# Patient Record
Sex: Male | Born: 1935 | Race: Black or African American | Hispanic: No | Marital: Single
Health system: Southern US, Community
[De-identification: ages and names within clinical notes are randomized; demographics above are authoritative.]

---

## 2013-05-24 ENCOUNTER — Inpatient Hospital Stay: Payer: Self-pay | Admitting: Internal Medicine

## 2013-05-24 LAB — CBC WITH DIFFERENTIAL/PLATELET
Basophil #: 0 10*3/uL (ref 0.0–0.1)
Eosinophil #: 0 10*3/uL (ref 0.0–0.7)
Eosinophil %: 0.1 %
HCT: 41.9 % (ref 40.0–52.0)
HGB: 13.7 g/dL (ref 13.0–18.0)
Lymphocyte #: 1 10*3/uL (ref 1.0–3.6)
Lymphocyte %: 13.5 %
MCH: 29.8 pg (ref 26.0–34.0)
MCV: 91 fL (ref 80–100)
Monocyte #: 0.5 x10 3/mm (ref 0.2–1.0)
Monocyte %: 6 %
Neutrophil #: 6.2 10*3/uL (ref 1.4–6.5)
Platelet: 241 10*3/uL (ref 150–440)
RBC: 4.61 10*6/uL (ref 4.40–5.90)
RDW: 14.8 % — ABNORMAL HIGH (ref 11.5–14.5)
WBC: 7.8 10*3/uL (ref 3.8–10.6)

## 2013-05-24 LAB — BASIC METABOLIC PANEL
Anion Gap: 9 (ref 7–16)
Chloride: 103 mmol/L (ref 98–107)
EGFR (African American): 56 — ABNORMAL LOW
EGFR (Non-African Amer.): 49 — ABNORMAL LOW
Osmolality: 276 (ref 275–301)
Potassium: 3.6 mmol/L (ref 3.5–5.1)
Sodium: 137 mmol/L (ref 136–145)

## 2013-05-24 LAB — PROTIME-INR
INR: 1
Prothrombin Time: 13.4 secs (ref 11.5–14.7)

## 2013-05-24 LAB — URINALYSIS, COMPLETE
Glucose,UR: NEGATIVE mg/dL (ref 0–75)
Leukocyte Esterase: NEGATIVE
Nitrite: NEGATIVE
Protein: 100
Specific Gravity: 1.019 (ref 1.003–1.030)
Squamous Epithelial: NONE SEEN

## 2013-05-25 LAB — CBC WITH DIFFERENTIAL/PLATELET
Basophil %: 0.3 %
Eosinophil #: 0 10*3/uL (ref 0.0–0.7)
Eosinophil %: 0 %
HCT: 38.1 % — ABNORMAL LOW (ref 40.0–52.0)
HGB: 12.7 g/dL — ABNORMAL LOW (ref 13.0–18.0)
Lymphocyte #: 0.7 10*3/uL — ABNORMAL LOW (ref 1.0–3.6)
Lymphocyte %: 9.4 %
MCHC: 33.3 g/dL (ref 32.0–36.0)
MCV: 90 fL (ref 80–100)
Neutrophil #: 6.2 10*3/uL (ref 1.4–6.5)
Neutrophil %: 81.1 %
Platelet: 223 10*3/uL (ref 150–440)
RBC: 4.23 10*6/uL — ABNORMAL LOW (ref 4.40–5.90)
RDW: 14.6 % — ABNORMAL HIGH (ref 11.5–14.5)
WBC: 7.6 10*3/uL (ref 3.8–10.6)

## 2013-05-25 LAB — BASIC METABOLIC PANEL
BUN: 18 mg/dL (ref 7–18)
Calcium, Total: 9 mg/dL (ref 8.5–10.1)
Chloride: 105 mmol/L (ref 98–107)
Co2: 24 mmol/L (ref 21–32)
EGFR (African American): 59 — ABNORMAL LOW
EGFR (Non-African Amer.): 51 — ABNORMAL LOW
Osmolality: 277 (ref 275–301)
Potassium: 3.8 mmol/L (ref 3.5–5.1)
Sodium: 137 mmol/L (ref 136–145)

## 2013-05-26 LAB — PLATELET COUNT: Platelet: 179 10*3/uL (ref 150–440)

## 2013-05-26 LAB — BASIC METABOLIC PANEL
Anion Gap: 8 (ref 7–16)
BUN: 20 mg/dL — ABNORMAL HIGH (ref 7–18)
Calcium, Total: 8.7 mg/dL (ref 8.5–10.1)
Chloride: 104 mmol/L (ref 98–107)
Co2: 23 mmol/L (ref 21–32)
Creatinine: 1.34 mg/dL — ABNORMAL HIGH (ref 0.60–1.30)
EGFR (Non-African Amer.): 50 — ABNORMAL LOW
Glucose: 118 mg/dL — ABNORMAL HIGH (ref 65–99)
Osmolality: 274 (ref 275–301)
Potassium: 3.6 mmol/L (ref 3.5–5.1)
Sodium: 135 mmol/L — ABNORMAL LOW (ref 136–145)

## 2013-05-26 LAB — HEMOGLOBIN: HGB: 11.4 g/dL — ABNORMAL LOW (ref 13.0–18.0)

## 2013-05-30 LAB — PATHOLOGY REPORT

## 2014-01-09 DEATH — deceased

## 2014-10-02 IMAGING — CR DG CHEST 1V
1 series · 2 of 2 positions shown · non-contrast
Comparison: none

REASON FOR EXAM: fall
COMMENTS:

PROCEDURE:     DXR - DXR CHEST 1 VIEWAP OR PA  - May 24, 2013  [DATE]
RESULT:     The hemidiaphragm on the left is elevated. The aerated portions
of the lungs appear clear. The cardiac silhouette is mildly enlarged.
Pulmonary vascularity is not clearly engorged. There is no pleural effusion.

[Series 2: t chest supine · 0.14mm/px · 2 of 2 slices shown]
[im 1/2]
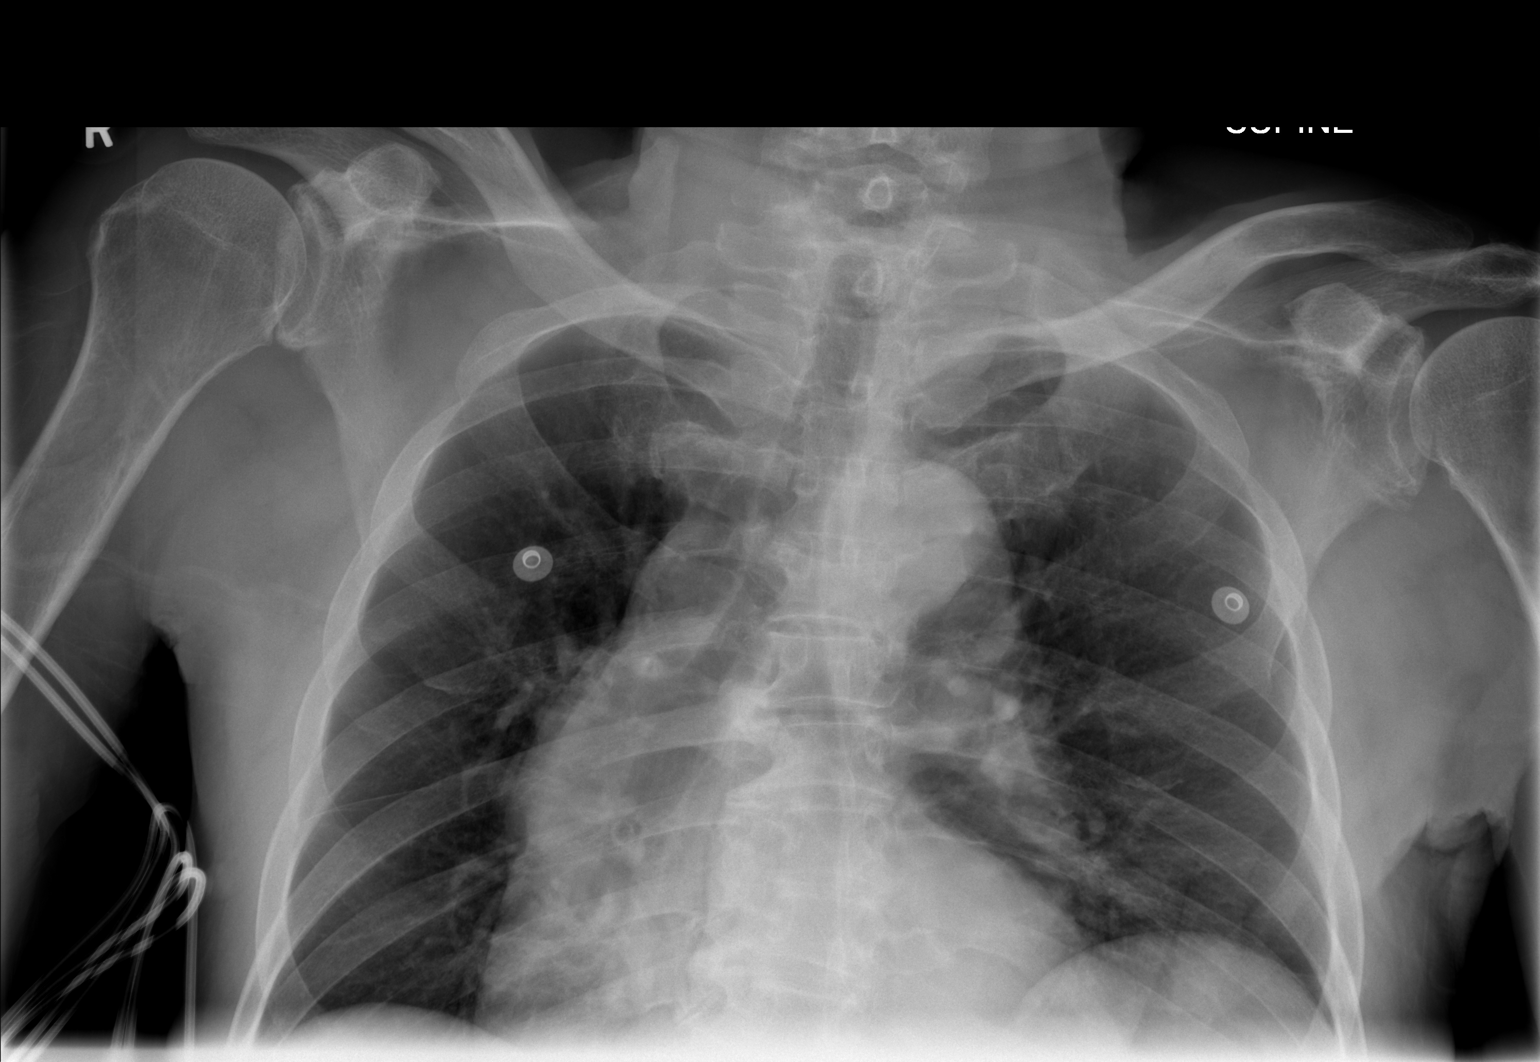
[im 2/2]
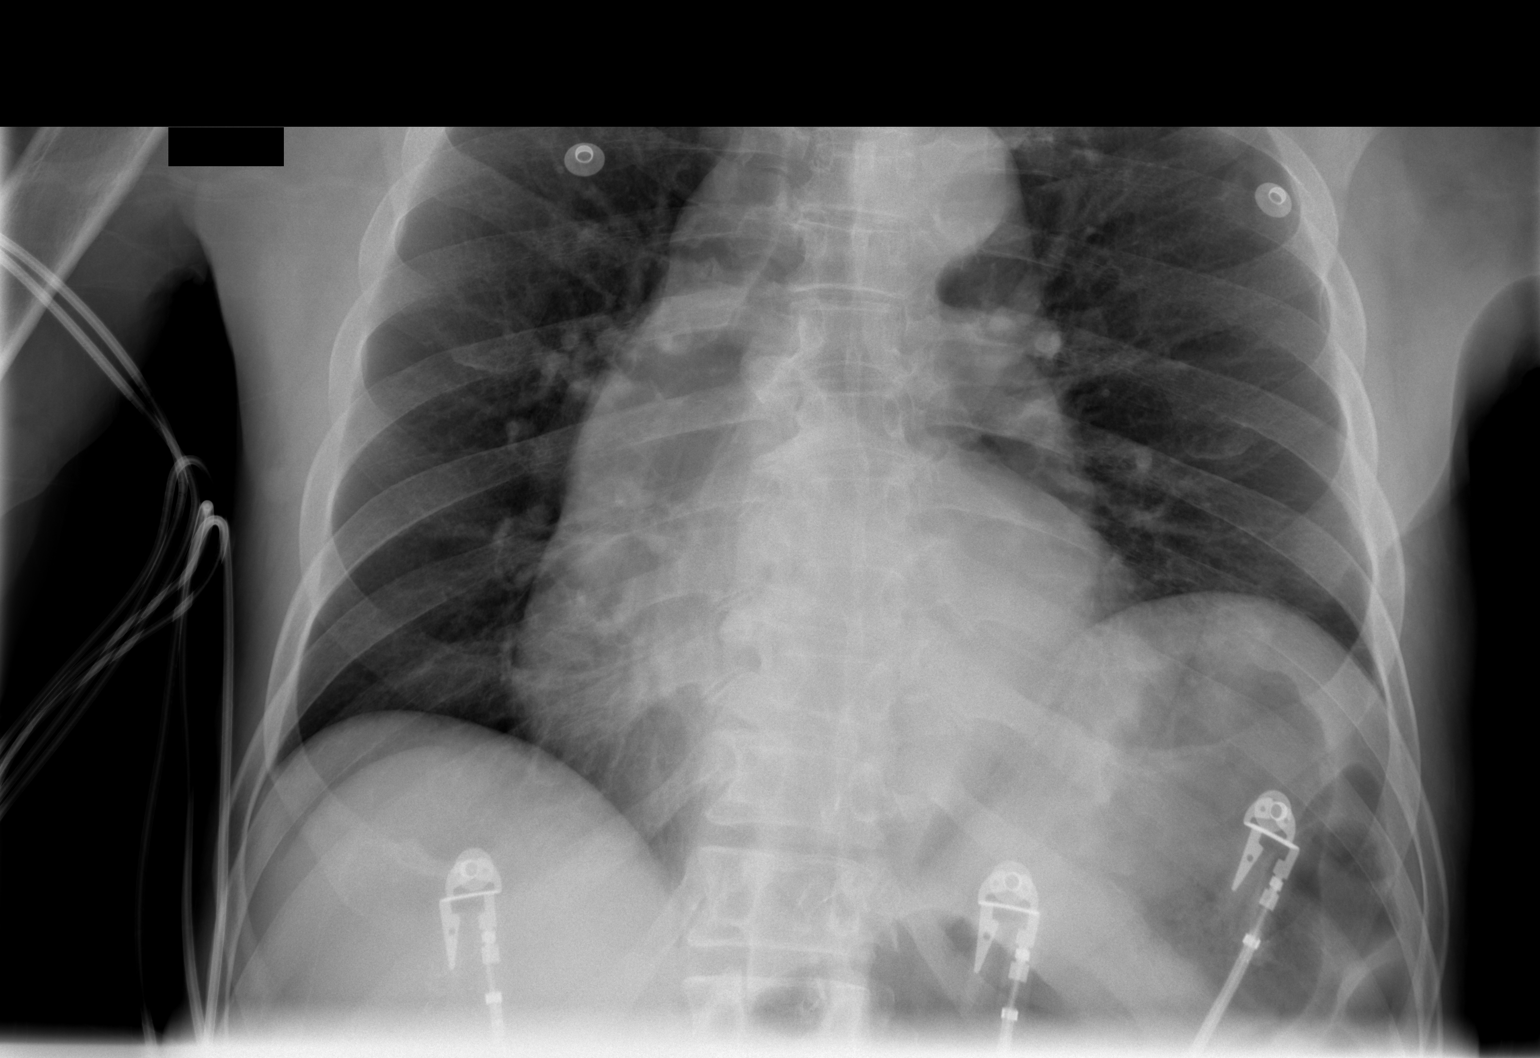

[2 of 2 positions shown; findings below may reference images not displayed]

IMPRESSION: There may be low-grade compensated CHF. There is no
evidence of pulmonary edema nor of pneumonia.

[REDACTED]

## 2014-10-02 IMAGING — CR DG HIP COMPLETE 2+V*L*
3 series · 4 of 4 positions shown · non-contrast
Comparison: none

REASON FOR EXAM: fall with L hip pain
COMMENTS:

[t hip ap left (1 of 3)]
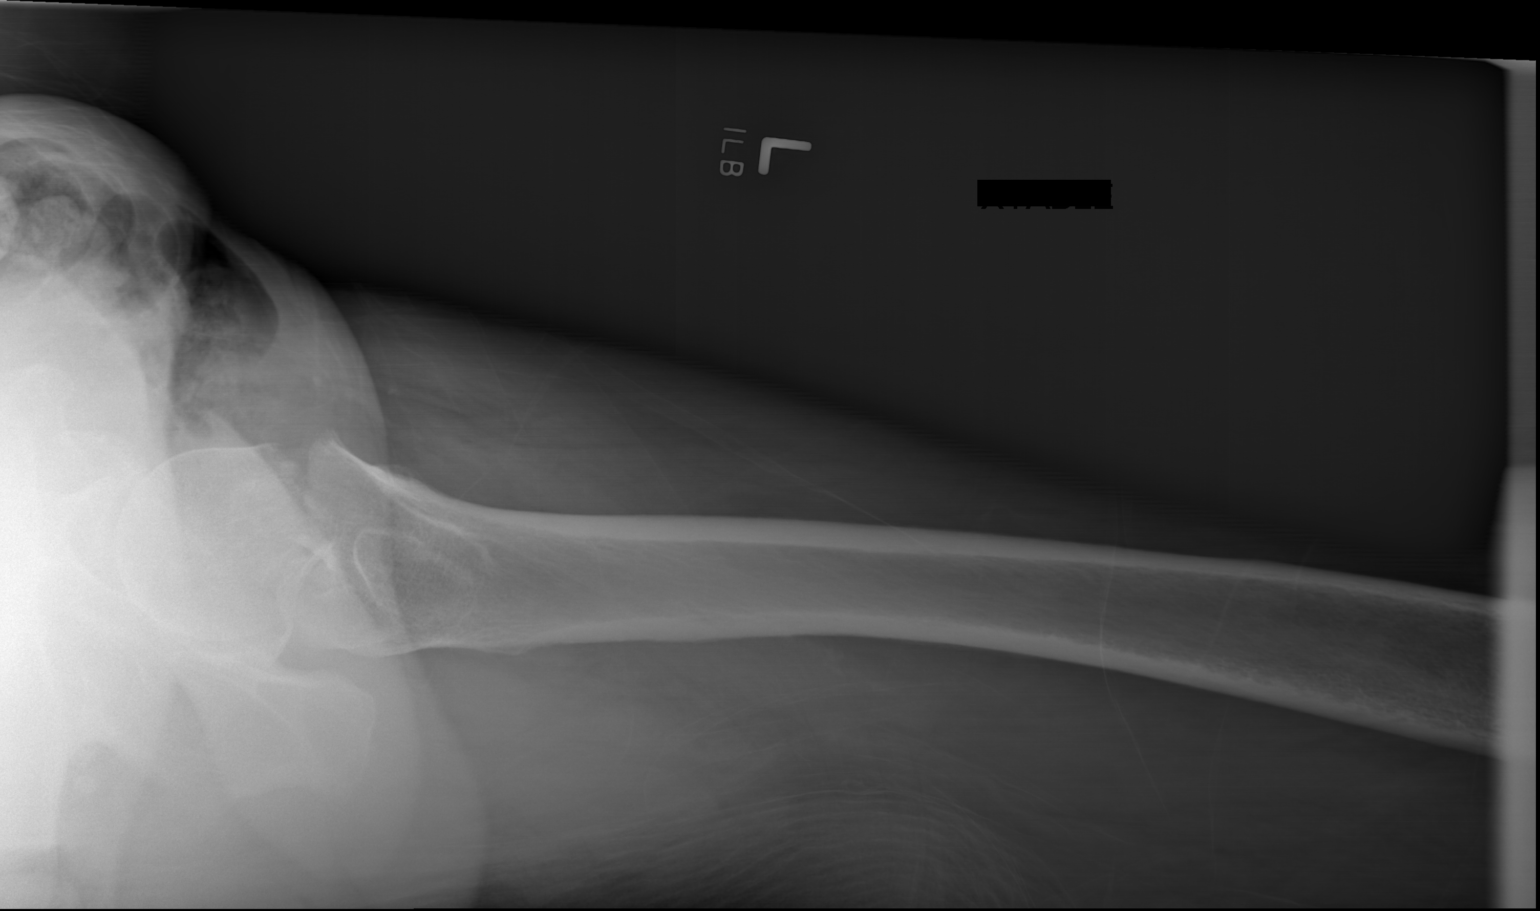

[t hip ap left (2 of 3)]
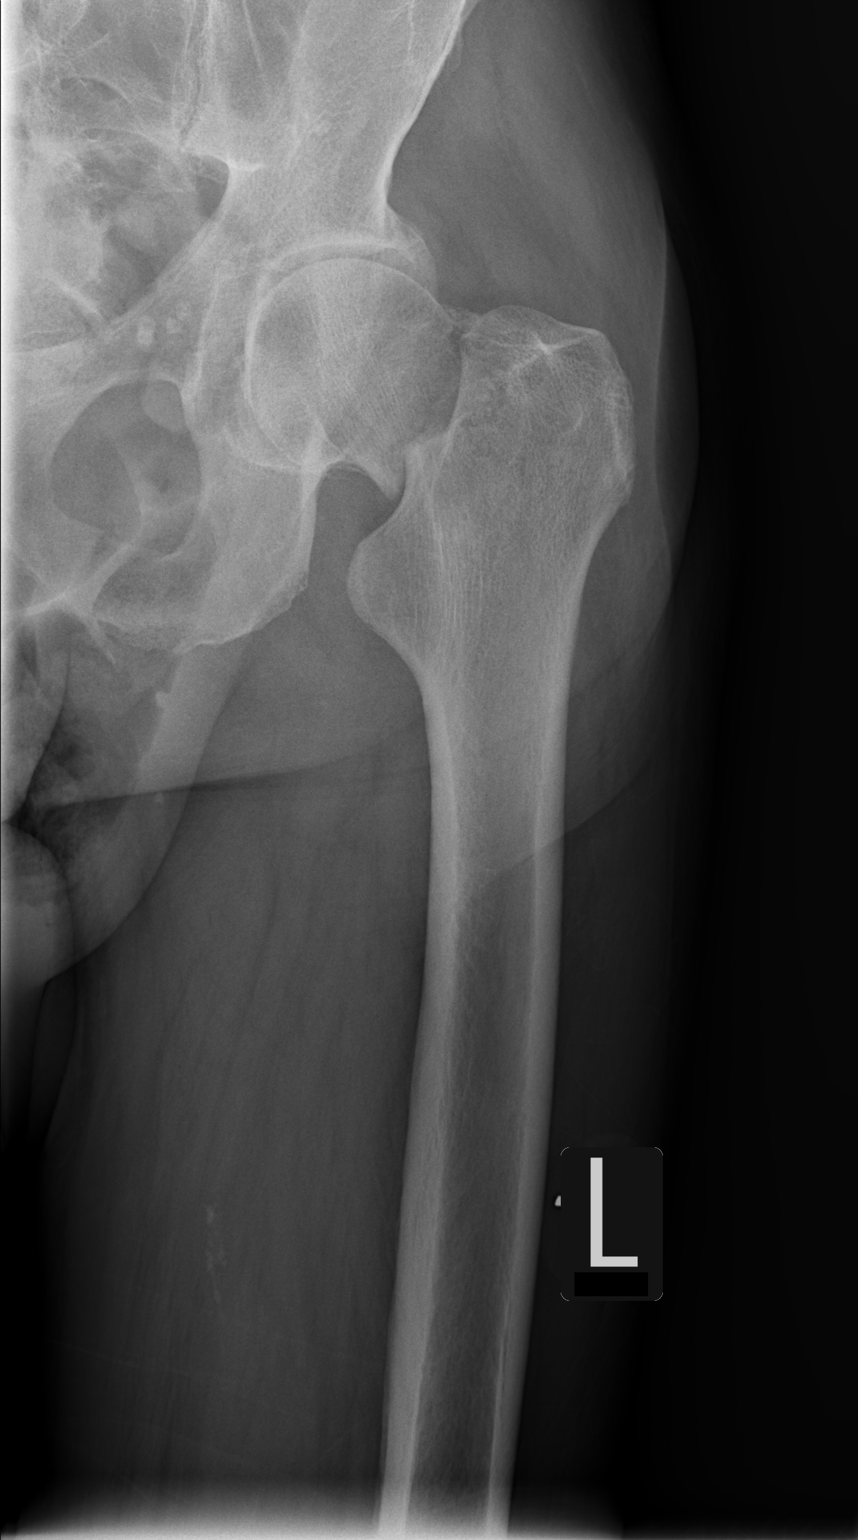

[Series 2: t hip ap left · 0.14mm/px · 2 of 2 slices shown (3 of 3)]
[im 1/2]
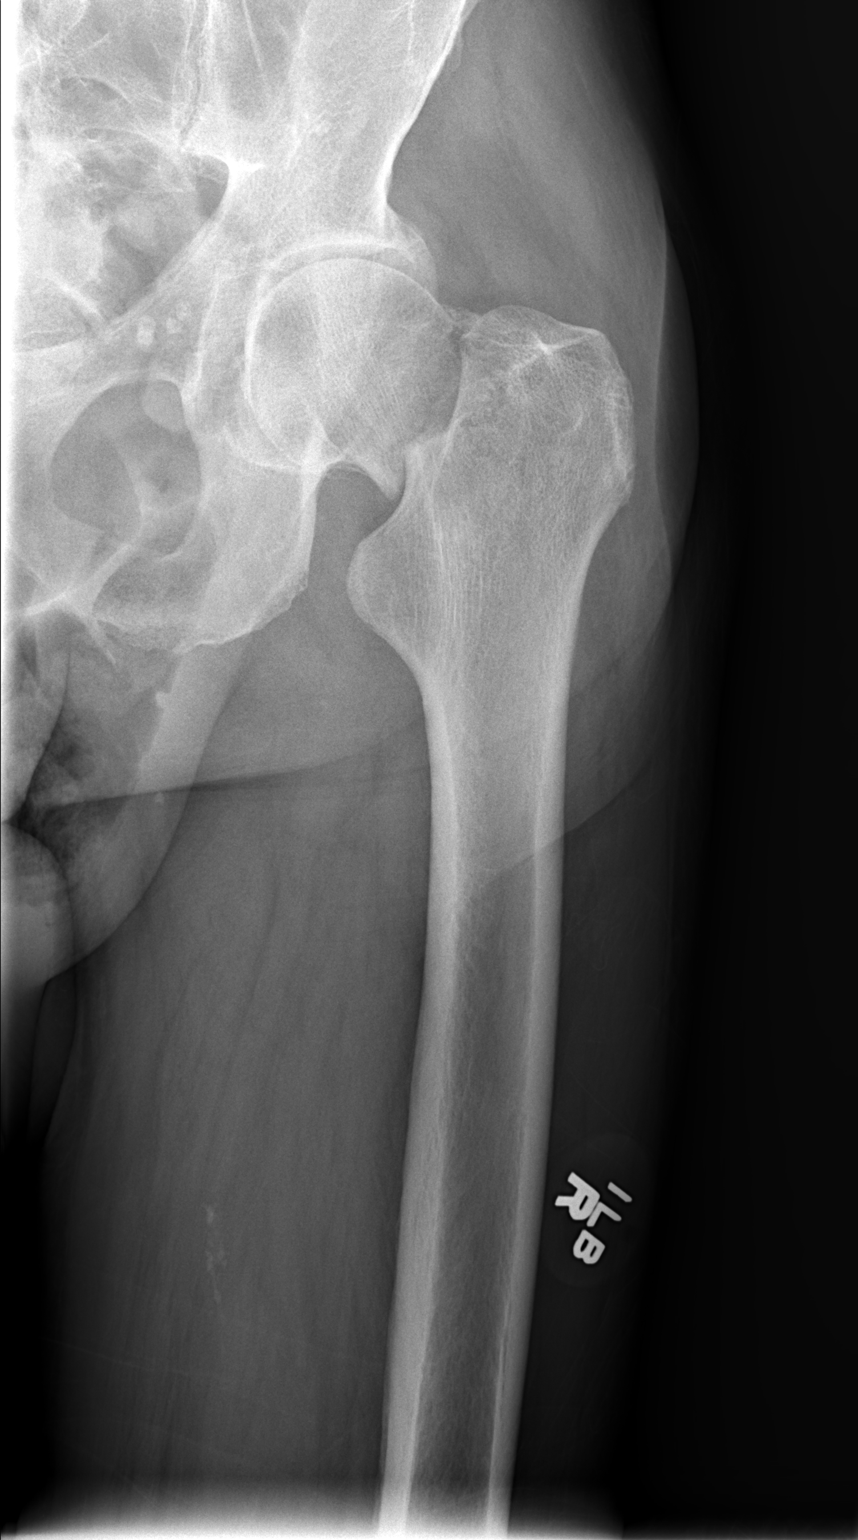
[im 2/2]
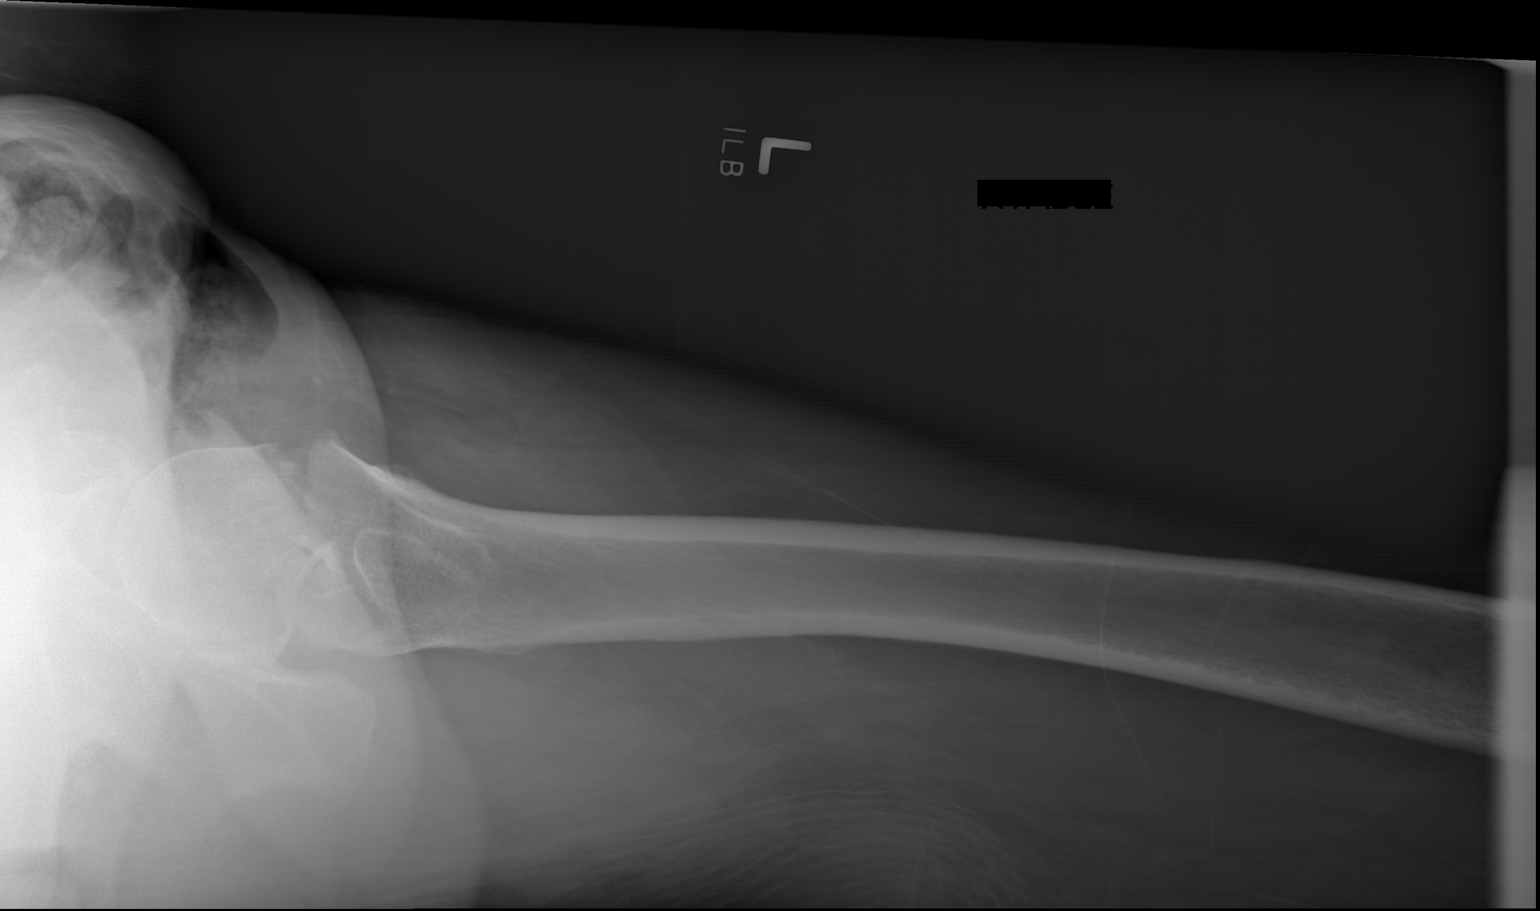

[4 of 4 positions shown; findings below may reference images not displayed]

PROCEDURE:     DXR - DXR HIP LEFT COMPLETE  - May 24, 2013  [DATE]

RESULT:      AP and frog-leg lateral views of the left hip reveal a fracture
at the base of the femoral neck. The femoral head remains normally
positioned within the acetabulum. No intertrochanteric or subtrochanteric
fracture is demonstrated.
IMPRESSION: The patient sustained a fracture of the neck of the right
femur.

[REDACTED]

Addendum: in the impression above I misspoke and indicated that the fracture
is of the right femur. In fact the fracture is of the left femur. The AP
view of the left hip is mismarked however with a right marker present. The
crosstable lateral view is correctly marked "left".

This addendum was created at [DATE] p.m. on 24 May, 2013.

## 2014-10-03 IMAGING — CR DG HIP COMPLETE 2+V*L*
1 series · 2 of 2 positions shown · non-contrast
Comparison: none

REASON FOR EXAM: s/p THA
COMMENTS:   Bedside (portable):Y

[Series 1: ap · 0.17mm/px · 2 of 2 slices shown]
[im 1/2]
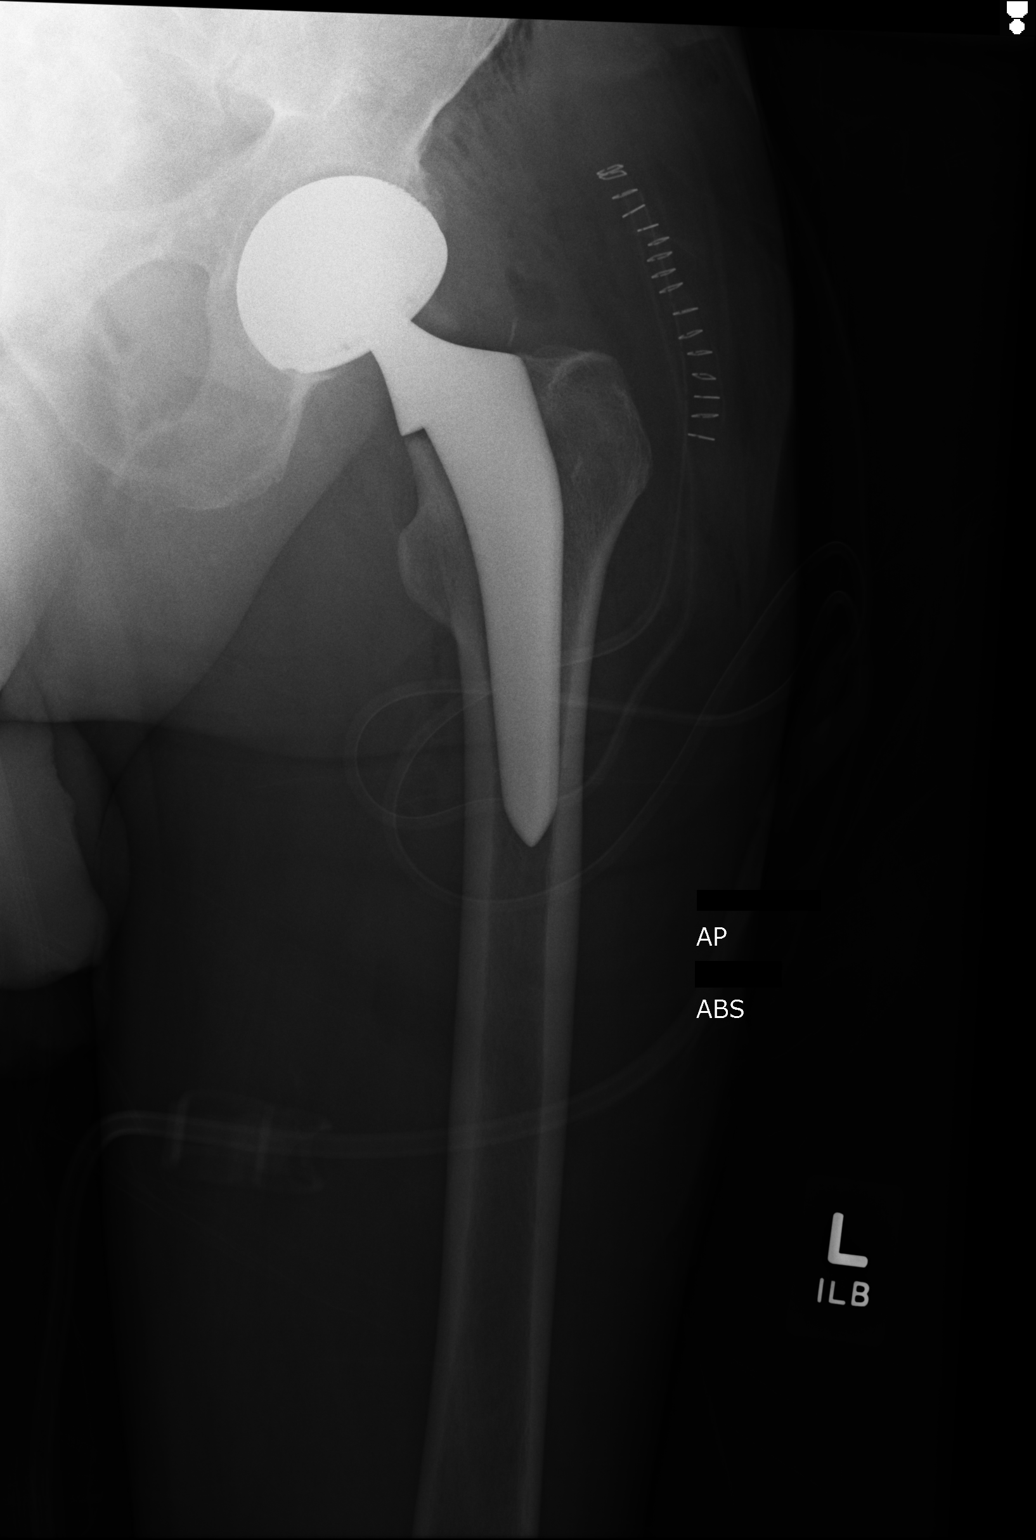
[im 2/2]
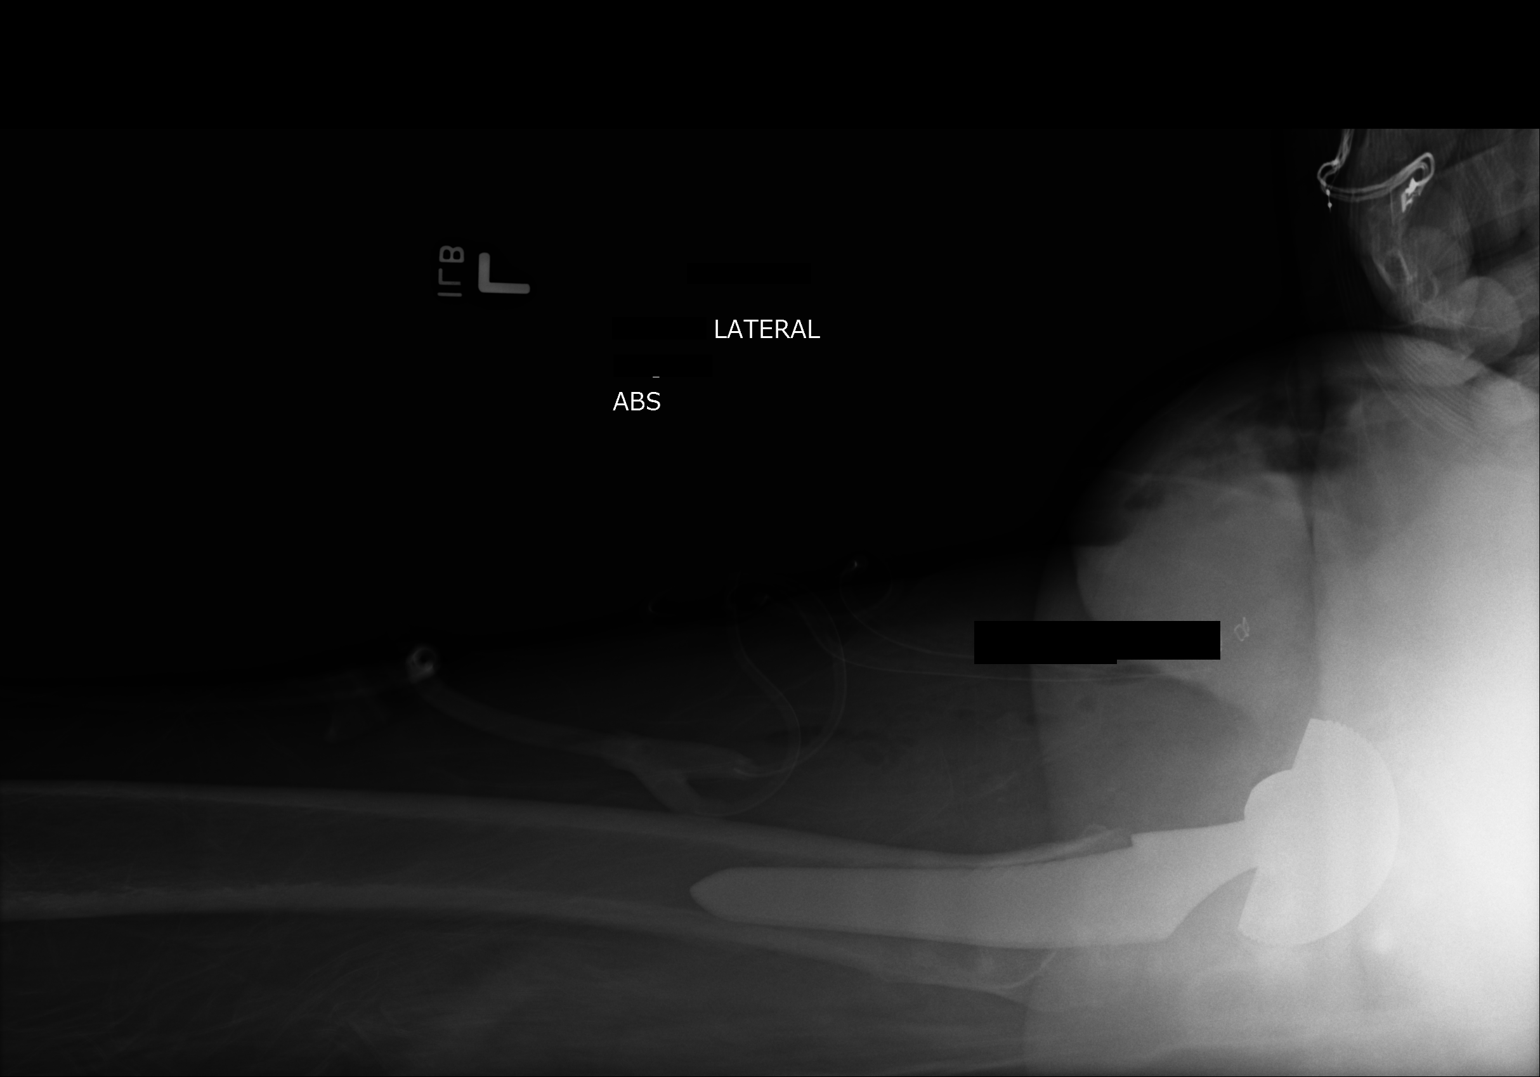

[2 of 2 positions shown; findings below may reference images not displayed]

PROCEDURE:     DXR - DXR HIP LEFT COMPLETE  - May 25, 2013  [DATE]

RESULT:

The patient is status post total left hip replacement. Hardware appears
intact without evidence of loosening or failure. Native osseous structures
demonstrate no evidence of fracture or dislocation. Skin staples and
surgical drains are appreciated.
IMPRESSION: The patient is status post total left hip replacement.

The remainder of the interpretation will be left to the performing physician.

## 2014-10-06 IMAGING — US US EXTREM LOW VENOUS*L*
1 series · 14 of 20 positions shown · non-contrast
Comparison: none

REASON FOR EXAM: swelling, pain, s/p THR
COMMENTS:

[Series 1: us extrem low venous*left* · 0.07mm/px · 14 of 20 slices shown]
[im 1/20]
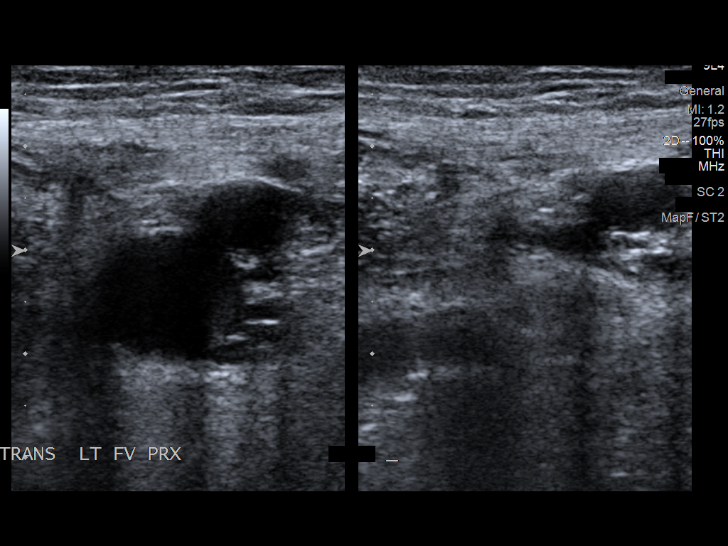
[im 3/20]
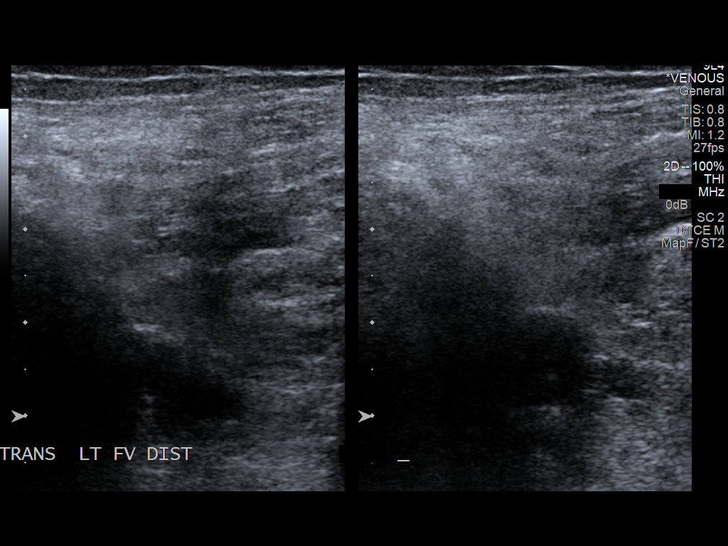
[im 4/20]
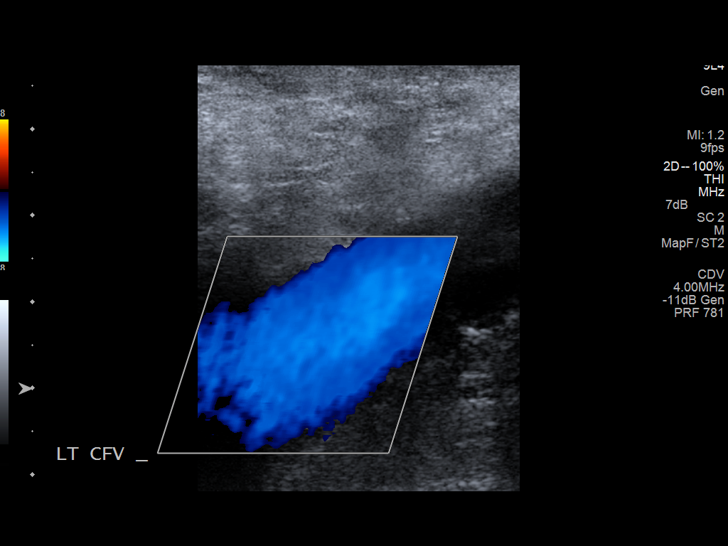
[im 6/20]
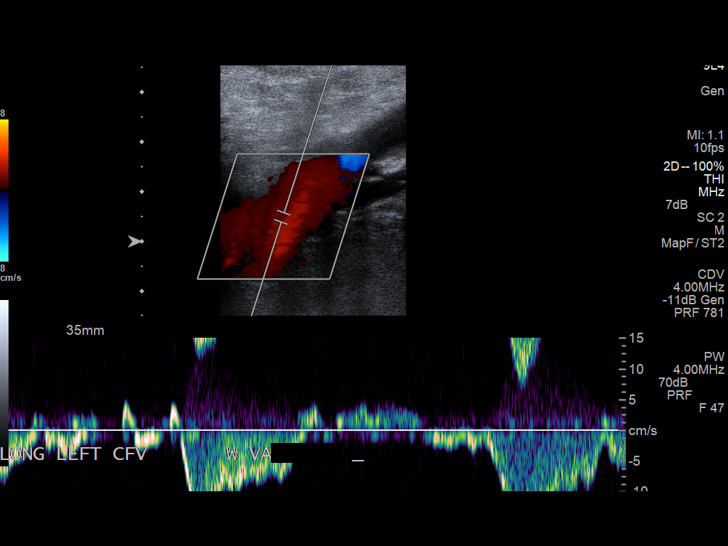
[im 7/20]
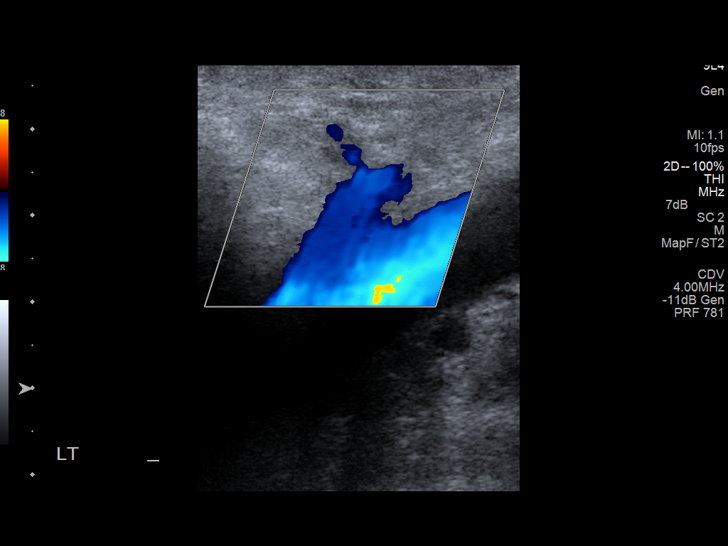
[im 8/20]
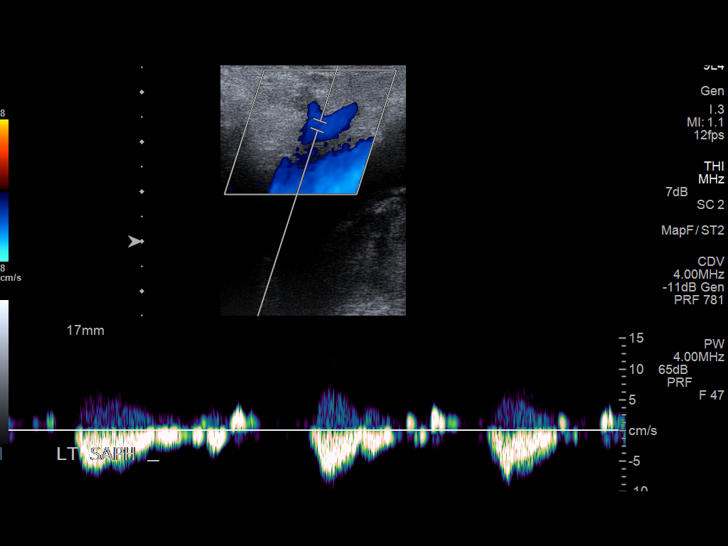
[im 10/20]
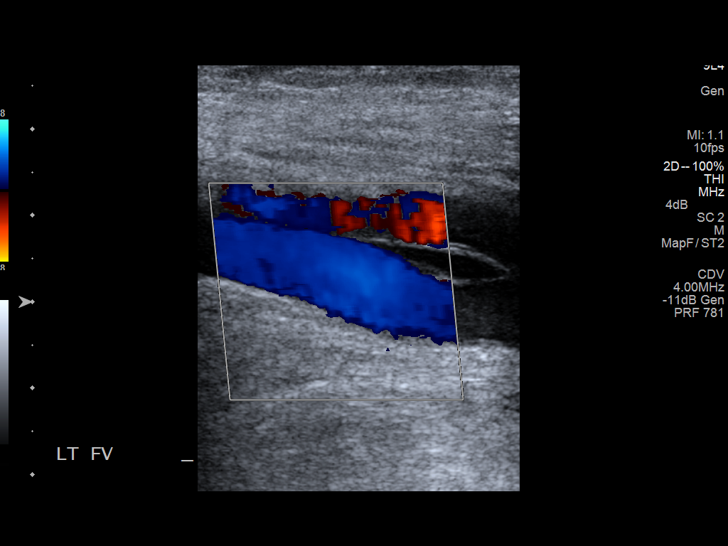
[im 11/20]
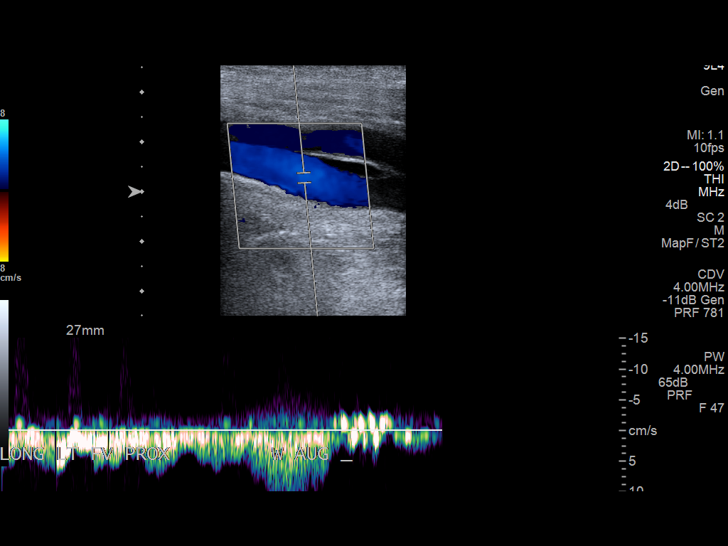
[im 13/20]
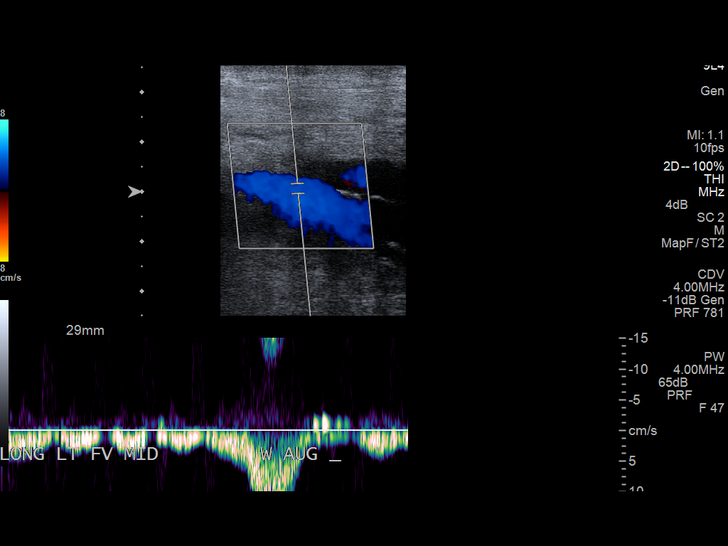
[im 14/20]
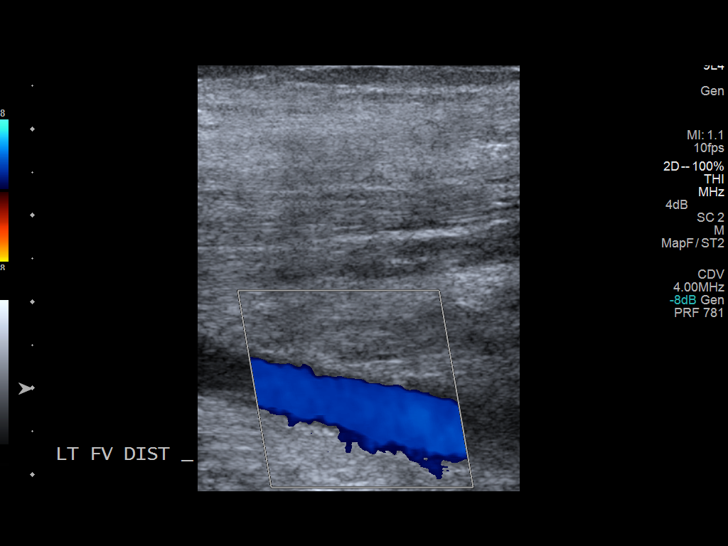
[im 16/20]
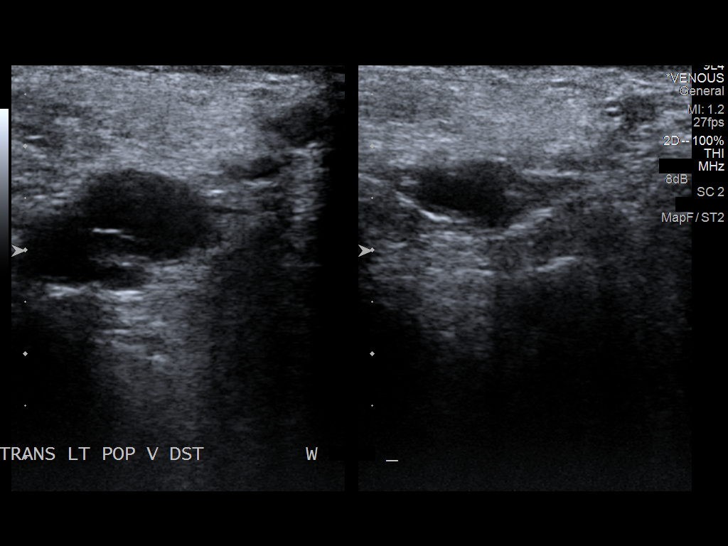
[im 17/20]
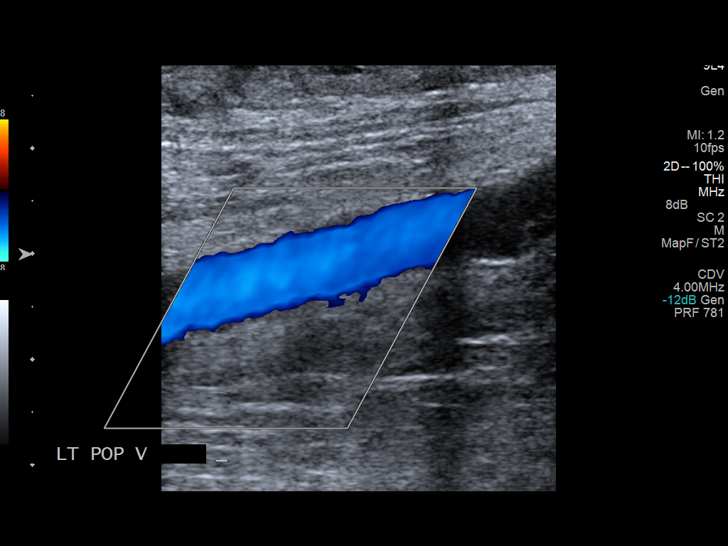
[im 18/20]
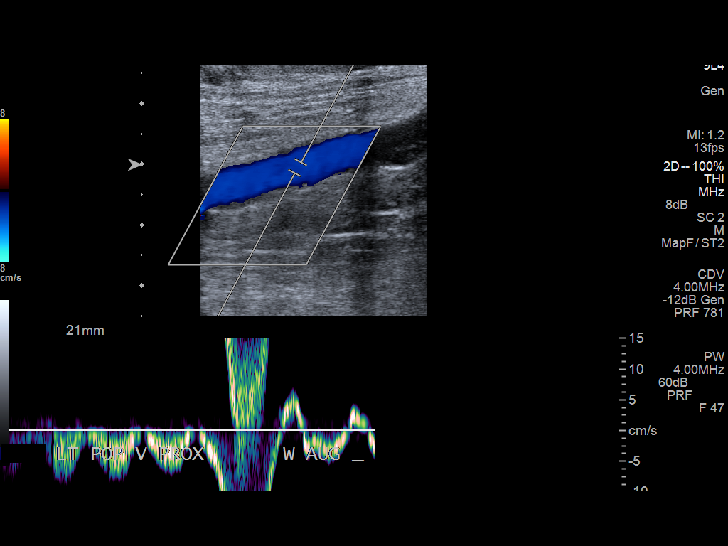
[im 20/20]
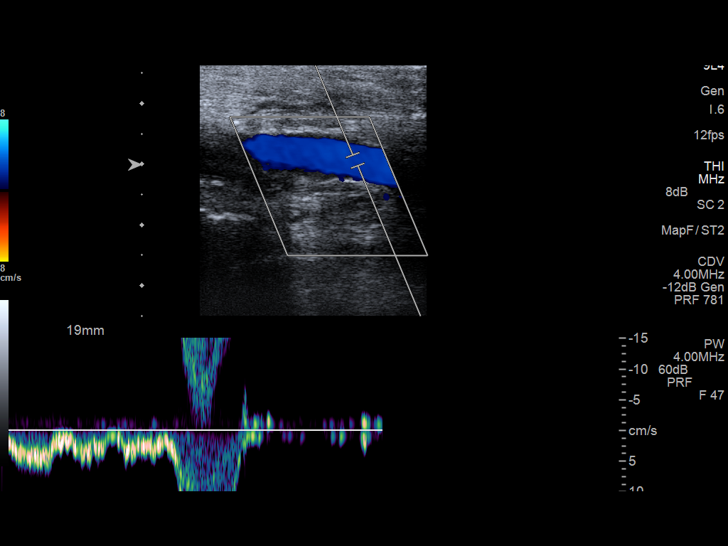

[14 of 20 positions shown; findings below may reference images not displayed]

PROCEDURE:     US  - US DOPPLER LOW EXTR LEFT  - May 28, 2013 [DATE]

RESULT:     Doppler interrogation of the deep venous system of the left leg
is performed from the common femoral vein through the popliteal vein. Ratio
compression images show complete compressibility. The color Doppler and
spectral Doppler appearance is are normal. There is no popliteal fossa cyst
or adenopathy.
IMPRESSION: 1. No evidence of left lower extremity deep vein thrombosis.

[REDACTED]

## 2014-12-01 NOTE — Discharge Summary (Signed)
PATIENT NAME:  Carlos Cummings, Carlos Cummings MR#:  [id number] DATE OF BIRTH:  10-23-1931  DATE OF ADMISSION:  05/24/2013 DATE OF DISCHARGE:  05/28/2013  ADDENDUM to discharge summary 05/28/2013.  On the day of discharge, the 18th of October, 2014, the patient was complaining of significant left lower extremity pain, mostly in the left thigh, which was also found to be somewhat swollen so ultrasound of his left lower extremity was performed to rule out the deep vein thrombosis. It showed no evidence of DVT. The patient was also evaluated by Dr. Michela PitcherEly since he was noted to have left inguinal hernia, and since he was very uncomfortable on evaluation, there was concern that the patient's left inguinal hernia could have been new or incarcerated; however, Dr. Michela PitcherEly felt that there was history of incarceration or strangulation. The patient's hernia was easily reducible and there was no tenderness in the scrotum noted and apparently the patient's inguinal hernia has been present for a number of years. No surgical indications were present and the patient will be discharged today on the 18th of October, 2014, to skilled nursing facility.   On the day of discharge, the patient's vital signs, temperature is 97.4, pulse was 98, respiratory rate was 18, blood pressure 139/80, O2 sats were 96% on room air at rest and it is unclear, but it could have been as low as 60 on room air on exertion; however, the patient's vital sign results are somewhat confusing. If patient requires oxygen therapy; however, if his O2 sats are as low 60 on exertion on room air, he will be discharged with oxygen therapy for the skilled nursing facility. Most recent chest x-ray done on admission, the 14th of October, 2014, revealed a low-grade decompensated CHF, but no evidence of pulmonary edema or pneumonia. We are going to repeat the patient's chest x-ray as well if his oxygenation is not good.    ____________________________ Carlos Caperima Mazie Fencl,  MD rv:jm D: 05/28/2013 14:50:52 ET T: 05/28/2013 16:25:49 ET JOB#: [id number]  cc: Carlos Caperima Caylen Yardley, MD, <Dictator> Cyanne Delmar MD ELECTRONICALLY SIGNED 05/31/2013 23:19

## 2014-12-01 NOTE — Op Note (Signed)
PATIENT NAME:  Carlos Cummings, Carlos Cummings MR#:  [id number] DATE OF BIRTH:  02/03/1932  DATE OF PROCEDURE:  05/25/2013  PREOPERATIVE DIAGNOSIS: Left femoral neck fracture and hip osteoarthritis.   POSTOPERATIVE DIAGNOSIS: Left femoral neck fracture and hip osteoarthritis.   PROCEDURE: Left total hip replacement, anterior approach.   ANESTHESIA: Spinal.   SURGEON: Kennedy BuckerMichael Jeslyn Amsler, M.D.   DESCRIPTION OF PROCEDURE: The patient was brought to the operating room and after adequate anesthesia was obtained, the patient was placed on the operative table with the left foot in the Medacta attachment and the right leg on a well-padded table. The C-arm was brought in and good visualization of the hip was obtained. The hip was then prepped and draped using the  standard fashion and appropriate patient identification, timeout procedures were completed.   A direct anterior approach was made with the incision centered over the TFL and the greater trochanter. The incision was carried down through the skin and subcutaneous tissue. The tensor fascia lata fascia was incised and the muscle retracted laterally. Deep fascia was then incised and the lateral femoral circumflex vessels ligated. The anterior capsule was then exposed and a flap created and retractors placed. The head was removed at the level of the fracture site and a revision cut was made of the neck. The acetabulum showed significant wear with fibrous tissue within the joint. This was debrided with use of a curette, and sequential reaming was carried out to 54 mm, at which point there was good bleeding bone. This was visualized under C-arm. A 54 mm trial fit very well and a 54 mm cup was impacted into place, and it appeared to be in the appropriate position.   Next, the leg was externally rotated and the pubofemoral and ischiofemoral ligaments were released. The leg dropped into extension and adduction and the proximal femur prepared using a box osteotome curette and  then sequential broaching up to a size 5. With a size 5 broach in place, trials were made and this appeared to be the appropriate stem with an M28 mm head and 54 mm liner.   The trial components were removed and the stem impacted to the appropriate level with an M head and Dual Mobility cup assembled. The hip was reduced after irrigating the joint. The hip was very stable at 90 degrees external rotation, and leg length appeared equal to the preop plan.   The wound was thoroughly irrigated. The joint was infiltrated with 0.25% Sensorcaine with epinephrine 30 mL. Wound was closed with a heavy Quill for the deep fascia, a subcutaneous drain, 2-0 Quill subcutaneously and skin staples. Xeroform, 4 x 4's, ABD, and tape applied. The patient was sent to the recovery room in stable condition.   ESTIMATED BLOOD LOSS: 100 mL.   COMPLICATIONS: None.   SPECIMENS: Femoral head.   IMPLANTS: Medacta AMIS, size 5 stem with a 54 mm Versafit cup DM with liner, and an M28 mm head.    ____________________________ Carlos SchullerMichael Cummings. Viggo Perko, MD mjm:np D: 05/25/2013 21:26:37 ET T: 05/25/2013 22:59:01 ET JOB#: [id number]  cc: Carlos SchullerMichael Cummings. Carlos Alicia, MD, <Dictator> Nolon BussingMICHAEL Cummings [facility] MD ELECTRONICALLY SIGNED 05/26/2013 7:31

## 2014-12-01 NOTE — Discharge Summary (Signed)
PATIENT NAME:  Carlos Cummings, Carlos Cummings MR#:  [id number] DATE OF BIRTH:  [birth year]  DATE OF ADMISSION:  05/24/2013 DATE OF DISCHARGE: 05/28/2013   DISPOSITION: Discharge to [facility].   DISCHARGE DIAGNOSES:  1.  Left total hip arthroplasty.  2.  Malignant hypertension.  3.  Chronic kidney stage, stage 3.  4.  Postoperative encephalopathy, resolved.  5.  Chronic intentional tremor.  6.  Acute blood loss anemia.   CONSULTANTS: Dr. Rosita KeaMenz of orthopedics.   IMAGING STUDIES: Include a left hip x-ray that showed a left femur intertrochanteric fracture.   ADMITTING HISTORY AND PHYSICAL: Please see detailed H and P dictated previously. In brief, a 81 year old African American male patient with history of hypertension, chronic intentional tremor, who presented to the hospital after having a mechanical fall with left hip pain. X-ray showed a left femur fracture. The patient was admitted to the hospitalist service and orthopedics consult.   HOSPITAL COURSE:  1.  Left hip fracture. The patient was taken to operating room by orthopedics for a left hip arthroplasty, which seemed to have gone uneventful with some mild acute blood loss anemia after surgery, which is stable. The patient has been on Lovenox for DVT prophylaxis. Pain is well controlled. He has worked with PT and will be discharged to skilled nursing facility on 05/28/2013.  2.  Hypertension, malignant. The patient did have elevated blood pressure at the time of admission, likely secondary from the pain, which has improved, but still consistently high for which metoprolol has been added to his lisinopril and hydrochlorothiazide.  3.  Chronic intentional tremor, stable.     DISCHARGE MEDICATIONS: Include:  1.  Acetaminophen/hydrocodone 325/5 mg 1 tablet oral every 6 hours as needed for pain.  2.  Enoxaparin 40 mg subcutaneous once a day for 14 days.  3.  MiraLax 17 grams oral once a day as needed for constipation.  4.   Docusate/senna 50/8.6 mg oral 2 times a day.  5.  Metoprolol tartrate 50 mg oral 2 times a day.  6.  Hydrochlorothiazide/lisinopril 25/20 mg 1 tablet oral once a day.  7.  Magnesium hydroxide 8% oral suspension 30 mL oral 2 times a day as needed for constipation.  8.  Oxycodone 5 mg oral every 4 hours as needed for pain.   DISCHARGE INSTRUCTIONS: Low-sodium diet. Activity as tolerated with assistance at all times. [facility] 4 to 6 weeks.   Time spent today on this patient was 40 minutes.  ____________________________ Molinda BailiffSrikar R. Merrel Crabbe, MD srs:aw D: 05/27/2013 14:38:00 ET T: 05/27/2013 14:55:53 ET JOB#: [id number]  cc: Wardell HeathSrikar R. Latunya Kissick, MD, <Dictator> Orie FishermanSRIKAR R Amena Dockham MD ELECTRONICALLY SIGNED 06/08/2013 13:11

## 2014-12-01 NOTE — H&P (Signed)
PATIENT NAME:  Carlos Cummings, Carlos Cummings MR#:  [id number] DATE OF BIRTH:  [birth year]  DATE OF ADMISSION:  05/24/2013  PRIMARY CARE PHYSICIAN: [facility] at [facility].   CHIEF COMPLAINT: Status post fall and left hip pain.   HISTORY OF PRESENT ILLNESS: This is a 81 year old male who presents to the hospital after a mechanical fall earlier today and noted to have some left hip pain. He was brought to the Emergency Room and noted to have a left hip fracture. The patient says that he was wearing some flip-flops and walking down some stairs when he tripped and fell to the left side. He did not hit his head, did not lose consciousness, and did not have any prodromal symptoms prior to the fall. He was having significant left hip pain after the fall and therefore came to the ER for further evaluation. The patient denied any chest pain, shortness of breath, nausea, vomiting, palpitations, dizziness, numbness, tingling, or any other associated symptoms prior to his fall.   REVIEW OF SYSTEMS: CONSTITUTIONAL: No documented fever. No weight gain or weight loss.  EYES: No blurred or double vision.  ENT: No tinnitus. No postnasal drip. No redness of the oropharynx.  RESPIRATORY: No cough, no wheeze, no hemoptysis, no dyspnea.  CARDIOVASCULAR: No chest pain, no orthopnea, no palpitations, no syncope.  GASTROINTESTINAL: No nausea, no vomiting, no diarrhea. No abdominal pain. No melena or hematochezia.  GENITOURINARY: No dysuria or hematuria.  ENDOCRINE: No polyuria, nocturia, heat or cold intolerance.  HEMATOLOGIC: No anemia. No bruising. No bleeding.  INTEGUMENTARY: No rashes. No lesions.  MUSCULOSKELETAL: No arthritis. No swelling. No gout.  NEUROLOGIC: No numbness or tingling. No ataxia. No seizure-type activity. Positive intentional tremor.  PSYCHIATRIC: No anxiety. No insomnia. No ADD.   PAST MEDICAL HISTORY: Consistent with just hypertension and chronic intentional tremor.   ALLERGIES: No known drug  allergies.   SOCIAL HISTORY: Used to smoke but quit 40+ years ago. No alcohol abuse. No illicit drug abuse. Lives by himself.   FAMILY HISTORY: Mother died from complications of a cerebral aneurysm. Father died from an MI.   CURRENT MEDICATIONS: Zestoretic which is hydrochlorothiazide/lisinopril 25/20, one tab daily.   PHYSICAL EXAMINATION:  VITAL SIGNS: Temperature is 97.8. Pulse 101, respirations 18, blood pressure 202/114, saturation 99% on 2 liters nasal cannula.  GENERAL: A pleasant-appearing male, no apparent distress.  HEENT: Atraumatic, normocephalic. Extraocular muscles are intact. Pupils equal and reactive  to light. Sclerae anicteric. No conjunctival injection. No pharyngeal erythema.  NECK: Supple. There is no jugular venous distention. No bruits, no lymphadenopathy or thyromegaly.  HEART: Tachycardic, regular. No murmurs. No rubs, no clicks.  LUNGS: Clear to auscultation bilaterally. No rales or rhonchi. No wheezes.  ABDOMEN: Soft, flat, nontender, nondistended. Has good bowel sounds. No hepatosplenomegaly appreciated.  EXTREMITIES: No evidence of any cyanosis, clubbing, or peripheral edema. Has +2 pedal and radial pulses bilaterally. The patient's left lower extremity is externally rotated and shortened due to left hip fracture.  SKIN: Moist and warm with no rashes.  LYMPHATIC: There is no cervical or axillary lymphadenopathy.   LABORATORY DATA: Serum glucose 121, BUN 14, creatinine 1.3, sodium 137, potassium 3.6, chloride 103, bicarb 25. White cell count 7.8, hemoglobin 13.7, hematocrit 41.9, platelet count 241. INR is 1.0. Urinalysis shows 2+ blood, no leukocyte esterase and no nitrites.   ASSESSMENT AND PLAN: This is a 81 year old male with a history of hypertension and  chronic intentional tremor who presents to the hospital after a mechanical fall  and noted to have a left hip fracture.   PROBLEMS:  1.  Preoperative evaluation. The patient is likely a low-to-moderate risk  for noncardiac surgery. No absolute contraindications to surgery at this time. The patient's EKG has been reviewed and  shows left ventricular hypertrophy with voltage criteria and left-axis deviation but no other acute ST or T-wave changes. I would proceed with surgery.  2.  Left hip fracture. This is likely secondary to the mechanical fall as mentioned. Orthopedics has been consulted. Continue care as per them.  3.  Malignant hypertension. Some of this elevated blood pressure could be due to the pain. The patient has some as hip fracture. I will give the patient 1 dose of IV labetalol while in the ER, place him on some IV p.r.n. hydralazine, continue his Zestoretic. If needed, we will add beta blockers.  4.  Chronic intentional tremor. There is no acute issue related to this. The patient has no history of Parkinson's. The patient is followed by his primary care physician and he has him on some p.r.n. medications for his tremor which he cannot recall, but the patient has not been compliant with it. The patient is able to ambulate despite this upper extremity tremor without a walker or a cane and is fairly independent.   CODE STATUS: FULL CODE.   The plan was discussed with the patient's family and they are in agreement.   TIME SPENT: 50 minutes.    ____________________________ Rolly Pancake. Cherlynn Kaiser, MD vjs:np D: 05/24/2013 18:43:27 ET T: 05/24/2013 19:00:51 ET JOB#: [id number]  cc: Rolly Pancake. Cherlynn Kaiser, MD, <Dictator> Houston Siren MD ELECTRONICALLY SIGNED 06/08/2013 14:32

## 2014-12-01 NOTE — Consult Note (Signed)
PATIENT NAME:  Carlos Cummings, Carlos Cummings MR#:  [id number] DATE OF BIRTH:  20-Feb-1932  DATE OF CONSULTATION:  05/24/2013  CONSULTING PHYSICIAN:  Leitha SchullerMichael Cummings. Carlos Twilley, MD  REASON FOR CONSULTATION: Left hip fracture.   HISTORY OF PRESENT ILLNESS: The patient is an 81 year old male who suffered a fall while climbing up a step. He  has been active and ambulating without assistive device. His family does report he does have tremor but has been active and lives fairly independently. He denies prodromal symptoms.   PHYSICAL EXAMINATION:  His left leg is shortened and externally rotated. He has a trace dorsalis pedis pulse. He has edema in the lower extremity. He is able to flex and extend the toes and has intact sensation to the foot. Skin around the hip is intact.   X-RAYS: Reveal displaced femoral neck fracture with underlying osteoarthritis.   CLINICAL IMPRESSION: Displaced femoral neck fracture with underlying osteoarthritis in a patient with tremor  RECOMMENDATION: For direct anterior total hip to minimize the risk of dislocation. The risks, benefits and possible complications including blood clot and deep infection, dislocation,  as well as failure of the hardware to have ingrowth, were all discussed. Brochure regarding the procedure also given. If medically stable, hope to do surgery tomorrow to get him mobilized.   ____________________________ Leitha SchullerMichael Cummings. Carlos Montee, MD mjm:dmm D: 05/24/2013 19:24:17 ET T: 05/24/2013 19:52:51 ET JOB#: [id number]  cc: Leitha SchullerMichael Cummings. Carlos Howerter, MD, <Dictator> Leitha SchullerMICHAEL Cummings Carlos Meritt MD ELECTRONICALLY SIGNED 05/25/2013 7:23

## 2014-12-01 NOTE — Consult Note (Signed)
Brief Consult Note: Diagnosis: displaced left femoral neck fracxture with osteoarthritis.   Patient was seen by consultant.   Consult note dictated.   Recommend to proceed with surgery or procedure.   Orders entered.   Comments: plan DA hip replacement tomorrow if medically stable.  Procedure discussed wtih patient and family.  Electronic Signatures: Leitha SchullerMenz, Shaddai Shapley J (MD)  (Signed 14-Oct-14 19:18)  Authored: Brief Consult Note   Last Updated: 14-Oct-14 19:18 by Leitha SchullerMenz, Quadir Muns J (MD)
